# Patient Record
Sex: Female | Born: 1985 | Race: White | Hispanic: No | Marital: Single | Smoking: Current every day smoker
Health system: Southern US, Community
[De-identification: ages and names within clinical notes are randomized; demographics above are authoritative.]

## PROBLEM LIST (undated history)

## (undated) DIAGNOSIS — F909 Attention-deficit hyperactivity disorder, unspecified type: Secondary | ICD-10-CM

## (undated) DIAGNOSIS — K589 Irritable bowel syndrome without diarrhea: Secondary | ICD-10-CM

## (undated) DIAGNOSIS — F329 Major depressive disorder, single episode, unspecified: Secondary | ICD-10-CM

## (undated) DIAGNOSIS — F32A Depression, unspecified: Secondary | ICD-10-CM

## (undated) DIAGNOSIS — R002 Palpitations: Secondary | ICD-10-CM

## (undated) HISTORY — PX: TONSILLECTOMY: SUR1361

## (undated) HISTORY — DX: Palpitations: R00.2

---

## 2015-08-06 ENCOUNTER — Emergency Department (HOSPITAL_BASED_OUTPATIENT_CLINIC_OR_DEPARTMENT_OTHER)
Admission: EM | Admit: 2015-08-06 | Discharge: 2015-08-07 | Disposition: A | Discharge status: Home / Self Care | Payer: 59 | Attending: Emergency Medicine | Admitting: Emergency Medicine

## 2015-08-06 ENCOUNTER — Encounter (HOSPITAL_BASED_OUTPATIENT_CLINIC_OR_DEPARTMENT_OTHER): Payer: Self-pay | Admitting: *Deleted

## 2015-08-06 ENCOUNTER — Emergency Department (HOSPITAL_BASED_OUTPATIENT_CLINIC_OR_DEPARTMENT_OTHER): Payer: 59

## 2015-08-06 DIAGNOSIS — F17210 Nicotine dependence, cigarettes, uncomplicated: Secondary | ICD-10-CM | POA: Diagnosis not present

## 2015-08-06 DIAGNOSIS — R0602 Shortness of breath: Secondary | ICD-10-CM | POA: Diagnosis not present

## 2015-08-06 DIAGNOSIS — Z79899 Other long term (current) drug therapy: Secondary | ICD-10-CM | POA: Insufficient documentation

## 2015-08-06 DIAGNOSIS — R079 Chest pain, unspecified: Secondary | ICD-10-CM | POA: Insufficient documentation

## 2015-08-06 DIAGNOSIS — R002 Palpitations: Secondary | ICD-10-CM | POA: Diagnosis present

## 2015-08-06 DIAGNOSIS — F329 Major depressive disorder, single episode, unspecified: Secondary | ICD-10-CM | POA: Diagnosis not present

## 2015-08-06 HISTORY — DX: Irritable bowel syndrome without diarrhea: K58.9

## 2015-08-06 HISTORY — DX: Major depressive disorder, single episode, unspecified: F32.9

## 2015-08-06 HISTORY — DX: Depression, unspecified: F32.A

## 2015-08-06 HISTORY — DX: Attention-deficit hyperactivity disorder, unspecified type: F90.9

## 2015-08-06 LAB — CBC WITH DIFFERENTIAL/PLATELET
BASOS ABS: 0 10*3/uL (ref 0.0–0.1)
BASOS PCT: 0 %
EOS PCT: 2 %
Eosinophils Absolute: 0.2 10*3/uL (ref 0.0–0.7)
HEMATOCRIT: 36.8 % (ref 36.0–46.0)
Hemoglobin: 12.7 g/dL (ref 12.0–15.0)
Lymphocytes Relative: 36 %
Lymphs Abs: 3.3 10*3/uL (ref 0.7–4.0)
MCH: 28.2 pg (ref 26.0–34.0)
MCHC: 34.5 g/dL (ref 30.0–36.0)
MCV: 81.6 fL (ref 78.0–100.0)
MONO ABS: 0.8 10*3/uL (ref 0.1–1.0)
Monocytes Relative: 8 %
NEUTROS ABS: 4.9 10*3/uL (ref 1.7–7.7)
Neutrophils Relative %: 54 %
PLATELETS: 250 10*3/uL (ref 150–400)
RBC: 4.51 MIL/uL (ref 3.87–5.11)
RDW: 12.7 % (ref 11.5–15.5)
WBC: 9.1 10*3/uL (ref 4.0–10.5)

## 2015-08-06 MED ORDER — SODIUM CHLORIDE 0.9 % IV BOLUS (SEPSIS)
500.0000 mL | Freq: Once | INTRAVENOUS | Status: AC
  Administered 2015-08-07: 500 mL via INTRAVENOUS

## 2015-08-06 NOTE — ED Notes (Addendum)
Heart palpitations x 3 weeks. Worse at night. States she wore a Holter monitor in the past for same but never saw any abnormalities. She made life style changes by stopping caffeine.

## 2015-08-06 NOTE — ED Provider Notes (Signed)
CSN: [id number]     Arrival date & time 08/06/15  2135 History  By signing my name below, I, [facility] Washington, attest that this documentation has been prepared under the direction and in the presence of Yadhira Mckneely, MD. Electronically Signed: Randell PatientMarrissa Washington, ED Scribe. 08/07/2015. 12:00 AM.     Chief Complaint  Patient presents with  . Palpitations    Patient is a 30 y.o. female presenting with palpitations. The history is provided by the patient. No language interpreter was used.  Palpitations Palpitations quality:  Fast Onset quality:  Sudden Timing:  Constant Progression:  Waxing and waning Chronicity:  Recurrent Context: not anxiety, not appetite suppressants and not caffeine   Relieved by:  Nothing Worsened by:  Nothing Ineffective treatments:  Beta blockers Associated symptoms: chest pain   Associated symptoms: no diaphoresis   Risk factors: no diabetes mellitus     HPI Comments: Lovenia Kimshley Krukowski is a 30 y.o. female with a hx of ADHD who presents to the Emergency Department complaining of intermittent, moderate, gradually worsening heart palpitations onset 3 weeks ago, significantly worse in the past week. Pt states that over the past three weeks she has had recurrent episodes of heart palpitations with associated CP and SOB. Palpations worse at rest. She notes that these symptoms originally resolved with drinking Vitamin Water but that this drink has not provided any relief in the past week. She has taken a beta blocker today, stayed well-hydrated, and slept well without relief.  She takes Adderall periodically. Per pt, she notes similar symptoms in the past when she drank caffeinated beverages. Denies consuming caffeine. Denies any other symptoms currently.  Past Medical History  Diagnosis Date  . Depression   . IBS (irritable bowel syndrome)   . ADHD (attention deficit hyperactivity disorder)    Past Surgical History  Procedure Laterality Date  . Tonsillectomy      No family history on file. Social History  Substance Use Topics  . Smoking status: Current Every Day Smoker    Types: E-cigarettes  . Smokeless tobacco: None  . Alcohol Use: Yes     Comment: socc   OB History    No data available     Review of Systems  Constitutional: Negative for diaphoresis.  Respiratory: Negative for wheezing and stridor.   Cardiovascular: Positive for chest pain and palpitations. Negative for leg swelling.  All other systems reviewed and are negative.     Allergies  Review of patient's allergies indicates no known allergies.  Home Medications   Prior to Admission medications   Medication Sig Start Date End Date Taking? Authorizing Provider  Amphetamine-Dextroamphetamine (ADDERALL PO) Take by mouth.   Yes Historical Provider, MD  Citalopram Hydrobromide (CELEXA PO) Take by mouth.   Yes Historical Provider, MD  ClonazePAM (KLONOPIN PO) Take by mouth.   Yes Historical Provider, MD  Cyclobenzaprine HCl (FLEXERIL PO) Take by mouth.   Yes Historical Provider, MD  Dicyclomine HCl (BENTYL PO) Take by mouth.   Yes Historical Provider, MD  Esomeprazole Magnesium (NEXIUM PO) Take by mouth.   Yes Historical Provider, MD   BP 129/91 mmHg  Pulse 68  Temp(Src) 98.4 F (36.9 C) (Oral)  Resp 10  Ht 5\' 6"  (1.676 m)  Wt 140 lb (63.504 kg)  BMI 22.61 kg/m2  SpO2 100% Physical Exam  Constitutional: She is oriented to person, place, and time. She appears well-developed and well-nourished. No distress.  HENT:  Head: Normocephalic and atraumatic.  Mouth/Throat: Oropharynx is clear and moist  and mucous membranes are normal. No oropharyngeal exudate.  Moist mucus membranes.  Eyes: EOM are normal. Pupils are equal, round, and reactive to light.  Neck: Trachea normal and normal range of motion. Carotid bruit is not present.  Cardiovascular: Normal rate, regular rhythm and intact distal pulses.   Pulmonary/Chest: Effort normal and breath sounds normal. No stridor. No  respiratory distress. She has no wheezes. She has no rales.  Lungs CTA bilaterally.  Abdominal: Soft. Bowel sounds are normal. She exhibits no distension. There is no tenderness. There is no rebound and no guarding.  Musculoskeletal: Normal range of motion. She exhibits no edema or tenderness.  Neurological: She is alert and oriented to person, place, and time. She has normal reflexes.  Skin: Skin is warm and dry. She is not diaphoretic.  Psychiatric: She has a normal mood and affect. Her behavior is normal.  Nursing note and vitals reviewed.   ED Course  Procedures (including critical care time)  DIAGNOSTIC STUDIES: Oxygen Saturation is 100% on RA, normal by my interpretation.    COORDINATION OF CARE: 11:41 PM Will order, IV fluids, chest x-ray, BMP, CBC, pregnancy test, troponin, and d-dimer. Discussed treatment plan with pt at bedside and pt agreed to plan.  Medications  sodium chloride 0.9 % bolus 500 mL (not administered)    Labs Review Labs Reviewed  CBC WITH DIFFERENTIAL/PLATELET  BASIC METABOLIC PANEL  TROPONIN I  PREGNANCY, URINE  D-DIMER, QUANTITATIVE (NOT AT [facility])    Imaging Review No results found. I have personally reviewed and evaluated these images and lab results as part of my medical decision-making.   EKG Interpretation   Date/Time:  Friday August 06 2015 21:46:05 EDT Ventricular Rate:  81 PR Interval:  156 QRS Duration: 84 QT Interval:  368 QTC Calculation: 427 R Axis:   89 Text Interpretation:  Normal sinus rhythm Confirmed by [facility]  MD,  Josiah Nieto ([id number]) on 08/06/2015 11:39:00 PM      MDM   Final diagnoses:  None    Filed Vitals:   08/06/15 2142 08/06/15 2332  BP: 155/111 129/91  Pulse: 92 68  Temp: 98.4 F (36.9 C)   Resp: 20 10   Results for orders placed or performed during the hospital encounter of 08/06/15  CBC with Differential/Platelet  Result Value Ref Range   WBC 9.1 4.0 - 10.5 K/uL   RBC 4.51 3.87 - 5.11 MIL/uL    Hemoglobin 12.7 12.0 - 15.0 g/dL   HCT 60.4 54.0 - 98.1 %   MCV 81.6 78.0 - 100.0 fL   MCH 28.2 26.0 - 34.0 pg   MCHC 34.5 30.0 - 36.0 g/dL   RDW 19.1 47.8 - 29.5 %   Platelets 250 150 - 400 K/uL   Neutrophils Relative % 54 %   Neutro Abs 4.9 1.7 - 7.7 K/uL   Lymphocytes Relative 36 %   Lymphs Abs 3.3 0.7 - 4.0 K/uL   Monocytes Relative 8 %   Monocytes Absolute 0.8 0.1 - 1.0 K/uL   Eosinophils Relative 2 %   Eosinophils Absolute 0.2 0.0 - 0.7 K/uL   Basophils Relative 0 %   Basophils Absolute 0.0 0.0 - 0.1 K/uL  Basic metabolic panel  Result Value Ref Range   Sodium 137 135 - 145 mmol/L   Potassium 3.2 (L) 3.5 - 5.1 mmol/L   Chloride 103 101 - 111 mmol/L   CO2 27 22 - 32 mmol/L   Glucose, Bld 98 65 - 99 mg/dL   BUN 10  6 - 20 mg/dL   Creatinine, Ser 0.98 0.44 - 1.00 mg/dL   Calcium 8.8 (L) 8.9 - 10.3 mg/dL   GFR calc non Af Amer >60 >60 mL/min   GFR calc Af Amer >60 >60 mL/min   Anion gap 7 5 - 15  Troponin I  Result Value Ref Range   Troponin I <0.03 <0.03 ng/mL  Pregnancy, urine  Result Value Ref Range   Preg Test, Ur NEGATIVE NEGATIVE  D-dimer, quantitative (not at [facility])  Result Value Ref Range   D-Dimer, Quant <0.27 0.00 - 0.50 ug/mL-FEU   No results found.  Medications  potassium chloride SA (K-DUR,KLOR-CON) CR tablet 40 mEq (not administered)  sodium chloride 0.9 % bolus 500 mL (500 mLs Intravenous New Bag/Given 08/07/15 0001)   Stop e cigarettes.  Follow up with cardiology for holter monitor.  Based on history and exam patient has been appropriately medically screened and emergency conditions excluded. Patient is stable for discharge at this time.   Follow up provided and strict return precautions given.    I personally performed the services described in this documentation, which was scribed in my presence. The recorded information has been reviewed and is accurate.      Cy Blamer, MD 08/07/15 [phone]

## 2015-08-06 NOTE — ED Notes (Signed)
Pt states she has been going in and out of trigeminy and bigeminy x several days. Pt states palpitations are worse when laying down and reports chest pain and shob when having the palpitations. Pt states she has been staying well hydrated.

## 2015-08-07 ENCOUNTER — Encounter (HOSPITAL_BASED_OUTPATIENT_CLINIC_OR_DEPARTMENT_OTHER): Payer: Self-pay | Admitting: Emergency Medicine

## 2015-08-07 LAB — BASIC METABOLIC PANEL
ANION GAP: 7 (ref 5–15)
BUN: 10 mg/dL (ref 6–20)
CALCIUM: 8.8 mg/dL — AB (ref 8.9–10.3)
CHLORIDE: 103 mmol/L (ref 101–111)
CO2: 27 mmol/L (ref 22–32)
CREATININE: 0.56 mg/dL (ref 0.44–1.00)
Glucose, Bld: 98 mg/dL (ref 65–99)
POTASSIUM: 3.2 mmol/L — AB (ref 3.5–5.1)
SODIUM: 137 mmol/L (ref 135–145)

## 2015-08-07 LAB — TROPONIN I: Troponin I: <0.03 ng/mL (ref <0.03)

## 2015-08-07 LAB — D-DIMER, QUANTITATIVE: D-Dimer, Quant: <0.27 ug/mL-FEU (ref 0.00–0.50)

## 2015-08-07 LAB — PREGNANCY, URINE: Preg Test, Ur: NEGATIVE

## 2015-08-07 MED ORDER — POTASSIUM CHLORIDE CRYS ER 20 MEQ PO TBCR
40.0000 meq | EXTENDED_RELEASE_TABLET | Freq: Once | ORAL | Status: AC
  Administered 2015-08-07: 40 meq via ORAL

## 2015-08-07 MED ORDER — POTASSIUM CHLORIDE CRYS ER 20 MEQ PO TBCR
EXTENDED_RELEASE_TABLET | ORAL | Status: AC
  Filled 2015-08-07: qty 2

## 2015-08-07 NOTE — Discharge Instructions (Signed)
Holter Monitoring A Holter monitor is a small device that is used to detect abnormal heart rhythms. It clips to your clothing and is connected by wires to flat, sticky disks (electrodes) that attach to your chest. It is worn continuously for 24-48 hours. HOME CARE INSTRUCTIONS  Wear your Holter monitor at all times, even while exercising and sleeping, for as long as directed by your health care provider.  Make sure that the Holter monitor is safely clipped to your clothing or close to your body as recommended by your health care provider.  Do not get the monitor or wires wet.  Do not put body lotion or moisturizer on your chest.  Keep your skin clean.  Keep a diary of your daily activities, such as walking and doing chores. If you feel that your heartbeat is abnormal or that your heart is fluttering or skipping a beat:  Record what you are doing when it happens.  Record what time of day the symptoms occur.  Return your Holter monitor as directed by your health care provider.  Keep all follow-up visits as directed by your health care provider. This is important. SEEK IMMEDIATE MEDICAL CARE IF:  You feel lightheaded or you faint.  You have trouble breathing.  You feel pain in your chest, upper arm, or jaw.  You feel sick to your stomach and your skin is pale, cool, or damp.  You heartbeat feels unusual or abnormal.   This information is not intended to replace advice given to you by your health care provider. Make sure you discuss any questions you have with your health care provider.   Document Released: 10/08/2003 Document Revised: 01/30/2014 Document Reviewed: 08/18/2013 Elsevier Interactive Patient Education 2016 [employer]  

## 2015-08-09 DIAGNOSIS — R002 Palpitations: Secondary | ICD-10-CM | POA: Insufficient documentation

## 2015-08-09 DIAGNOSIS — F909 Attention-deficit hyperactivity disorder, unspecified type: Secondary | ICD-10-CM | POA: Insufficient documentation

## 2015-08-09 DIAGNOSIS — K589 Irritable bowel syndrome without diarrhea: Secondary | ICD-10-CM | POA: Insufficient documentation

## 2015-08-25 ENCOUNTER — Ambulatory Visit: Payer: 59 | Admitting: Cardiology

## 2015-09-05 ENCOUNTER — Encounter: Payer: Self-pay | Admitting: Cardiology

## 2015-09-06 ENCOUNTER — Encounter: Payer: Self-pay | Admitting: Cardiology

## 2015-09-06 ENCOUNTER — Ambulatory Visit (INDEPENDENT_AMBULATORY_CARE_PROVIDER_SITE_OTHER): Payer: 59 | Admitting: Cardiology

## 2015-09-06 VITALS — BP 110/82 | HR 73 | Ht 66.0 in | Wt 145.4 lb

## 2015-09-06 DIAGNOSIS — R002 Palpitations: Secondary | ICD-10-CM | POA: Diagnosis not present

## 2015-09-06 NOTE — Progress Notes (Signed)
Electrophysiology Office Note   Date:  09/07/2015   ID:  Lindsay Cox, DOB 07-20-1985, MRN [id number]  PCP:  Pcp Not In System  Primary Electrophysiologist:  Lindsay Whitfill Jorja LoaMartin Aneisa Karren, MD    Chief Complaint  Patient presents with  . New Patient (Initial Visit)  . Palpitations     History of Present Illness: Lindsay Cox is a 30 y.o. female who presents today for electrophysiology evaluation.   History of ADHD presented to the ER 7/14 with worsening palpitations over the last 3 months. Palpitations associated with CP and SOB.  They are worse at rest, but do occur when active.  Her symptoms at that time were relieved by drinking vitamin water.  She has had similar symptoms when drinking caffeine.  She also says that they are worse when she is laying down to sleep.  She says that these may be influenced by her GI symptoms of GERD and her hiatal hernia.    Today, she denies symptoms of chest pain, shortness of breath, orthopnea, PND, lower extremity edema, claudication, dizziness, presyncope, syncope, bleeding, or neurologic sequela. The patient is tolerating medications without difficulties and is otherwise without complaint today.    Past Medical History:  Diagnosis Date  . ADHD (attention deficit hyperactivity disorder)   . Depression   . IBS (irritable bowel syndrome)   . Palpitations    Past Surgical History:  Procedure Laterality Date  . TONSILLECTOMY       Current Outpatient Prescriptions  Medication Sig Dispense Refill  . amphetamine-dextroamphetamine (ADDERALL XR) 20 MG 24 hr capsule Take 20 mg by mouth daily as needed.    . citalopram (CELEXA) 40 MG tablet Take 40 mg by mouth daily.    . ClonazePAM (KLONOPIN PO) Take 5 mg by mouth daily as needed.     . Cyclobenzaprine HCl (FLEXERIL PO) Take 5 mg by mouth 2 (two) times daily as needed.     . dicyclomine (BENTYL) 20 MG tablet Take 20 mg by mouth daily as needed.    Marland Kitchen. esomeprazole (NEXIUM) 20 MG capsule Take 20 mg by mouth  daily as needed.    Marland Kitchen. ibuprofen (ADVIL,MOTRIN) 200 MG tablet Take 200 mg by mouth every 6 (six) hours as needed.     No current facility-administered medications for this visit.     Allergies:   Review of patient's allergies indicates no known allergies.   Social History:  The patient  reports that she has been smoking E-cigarettes.  She has never used smokeless tobacco. She reports that she drinks alcohol. She reports that she does not use drugs.   Family History:  The patient's family history includes Anxiety disorder in her paternal grandmother; Depression in her paternal grandmother; Drug abuse in her father; Hypertension in her maternal grandfather; Throat cancer in her maternal grandfather and mother.    ROS:  Please see the history of present illness.   Otherwise, review of systems is positive for palpitations.   All other systems are reviewed and negative.    PHYSICAL EXAM: VS:  BP 110/82   Pulse 73   Ht 5\' 6"  (1.676 m)   Wt 145 lb 6.4 oz (66 kg)   BMI 23.47 kg/m  , BMI Body mass index is 23.47 kg/m. GEN: Well nourished, well developed, in no acute distress  HEENT: normal  Neck: no JVD, carotid bruits, or masses Cardiac: RRR; no murmurs, rubs, or gallops,no edema  Respiratory:  clear to auscultation bilaterally, normal work of breathing GI: soft, nontender,  nondistended, + BS MS: no deformity or atrophy  Skin: warm and dry Neuro:  Strength and sensation are intact Psych: euthymic mood, full affect  EKG:  EKG is ordered today. Personal review of the ekg ordered shows sinus rhythm, rate 73  ECGs from EMS show occasional PVCs  Recent Labs: 08/06/2015: BUN 10; Creatinine, Ser 0.56; Hemoglobin 12.7; Platelets 250; Potassium 3.2; Sodium 137    Lipid Panel  No results found for: CHOL, TRIG, HDL, CHOLHDL, VLDL, LDLCALC, LDLDIRECT   Wt Readings from Last 3 Encounters:  09/06/15 145 lb 6.4 oz (66 kg)  08/06/15 140 lb (63.5 kg)      Other studies  Reviewed: Additional studies/ records that were reviewed today include: ER notes   ASSESSMENT AND PLAN:  1.  Palpitations: Had a high burden of PVCs on the rhythm strip that she brought in. Unsure if she is having enough to cause a change in her LVEF.  Due to that, Lindsay Cox order a holter monitor to further assess her PVC burden.  If it is elevated, Lindsay Cox potentially plan for ablation.  If it is lower, Lindsay Cox try medical management.    Current medicines are reviewed at length with the patient today.   The patient does not have concerns regarding her medicines.  The following changes were made today:  none  Labs/ tests ordered today include:  Orders Placed This Encounter  Procedures  . Holter monitor - 48 hour  . EKG 12-Lead     Disposition:   FU with Lindsay Cox 3 months  Signed, Lindsay Luiz Jorja LoaMartin Corliss Coggeshall, MD  09/07/2015 1:36 PM     [facility] [address] [place] [address] [phone] (office) [phone] (fax)

## 2015-09-06 NOTE — Patient Instructions (Signed)
Your physician has recommended that you wear a holter monitor. Holter monitors are medical devices that record the heart's electrical activity. Doctors most often use these monitors to diagnose arrhythmias. Arrhythmias are problems with the speed or rhythm of the heartbeat. The monitor is a small, portable device. You can wear one while you do your normal daily activities. This is usually used to diagnose what is causing palpitations/syncope (passing out).  Follow up with your physician will depend on test results.

## 2018-06-16 IMAGING — DX DG CHEST 2V
2 series · 2 of 2 positions shown · non-contrast
Comparison: None.

CLINICAL DATA: Heart palpitations for 3 weeks. Chest pain and
shortness of breath.

EXAM:
CHEST  2 VIEW

[chest pa]
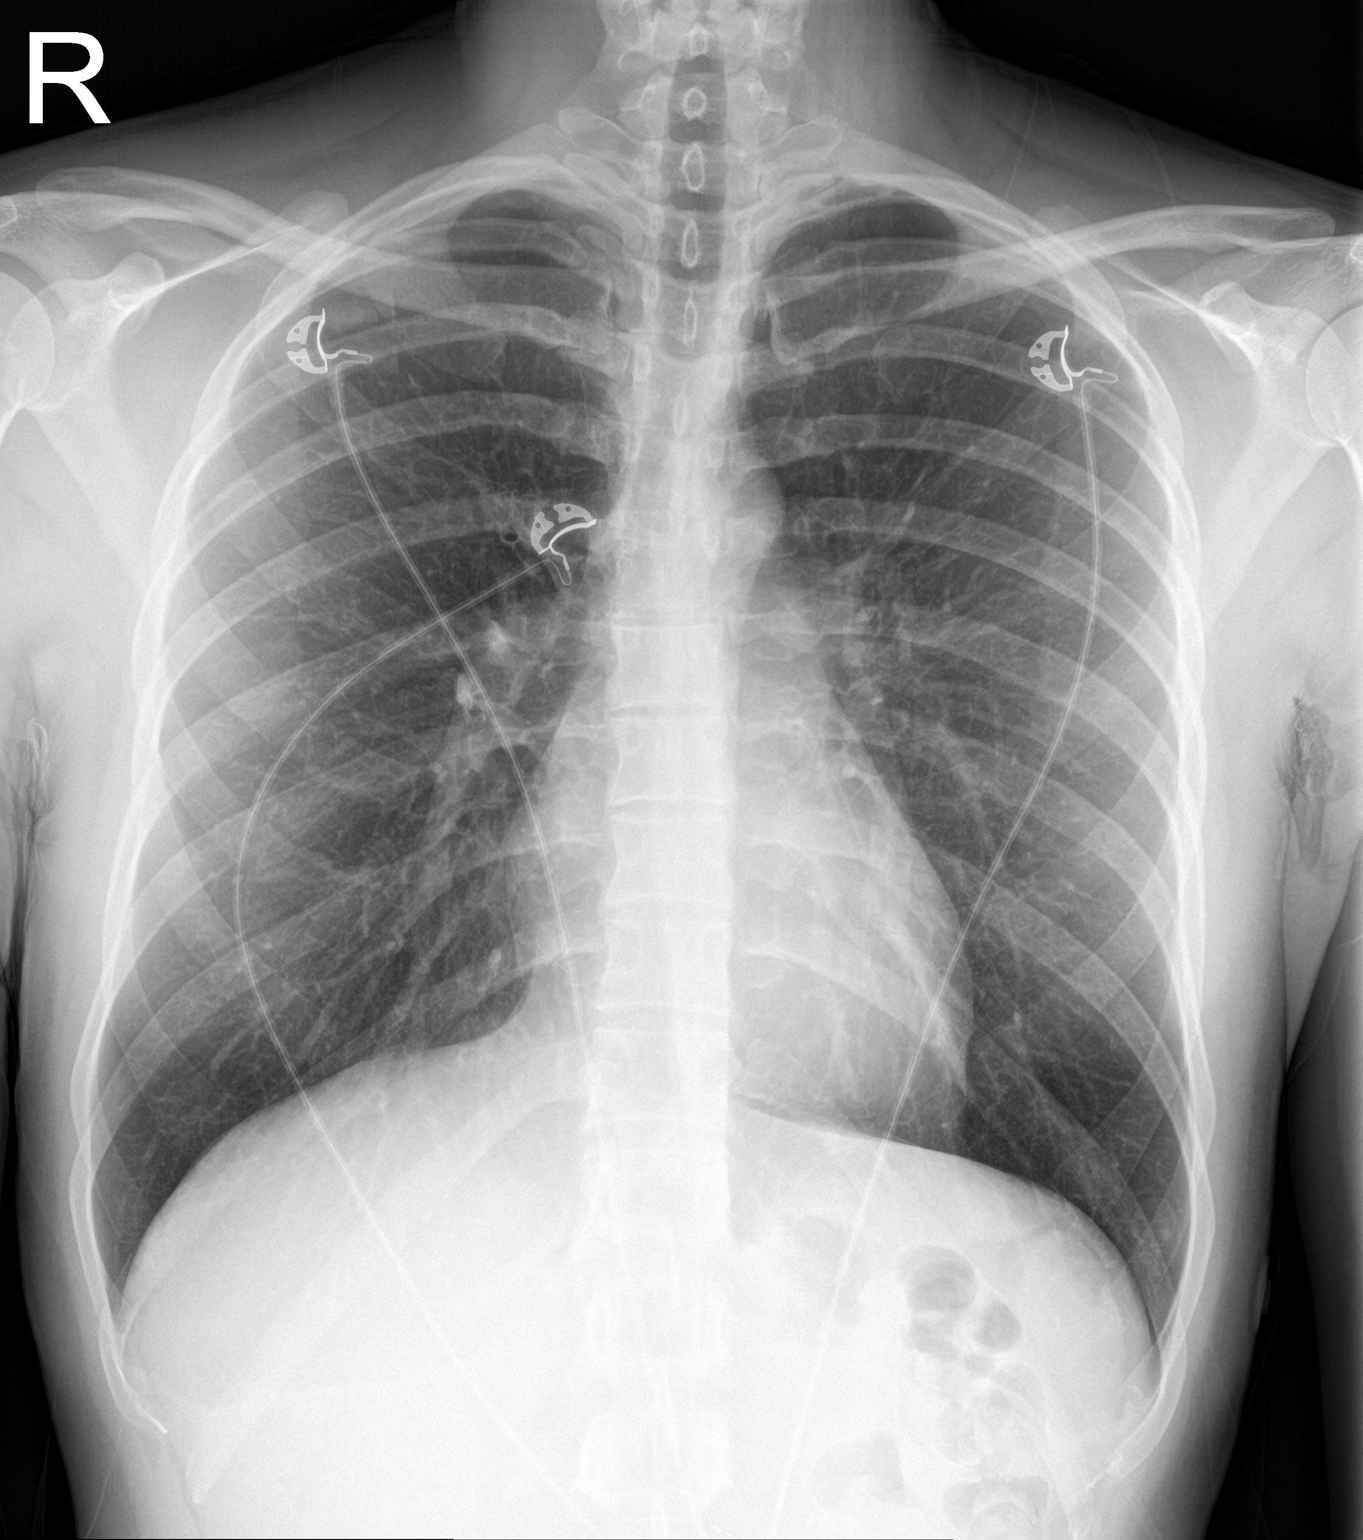

[chest lat]
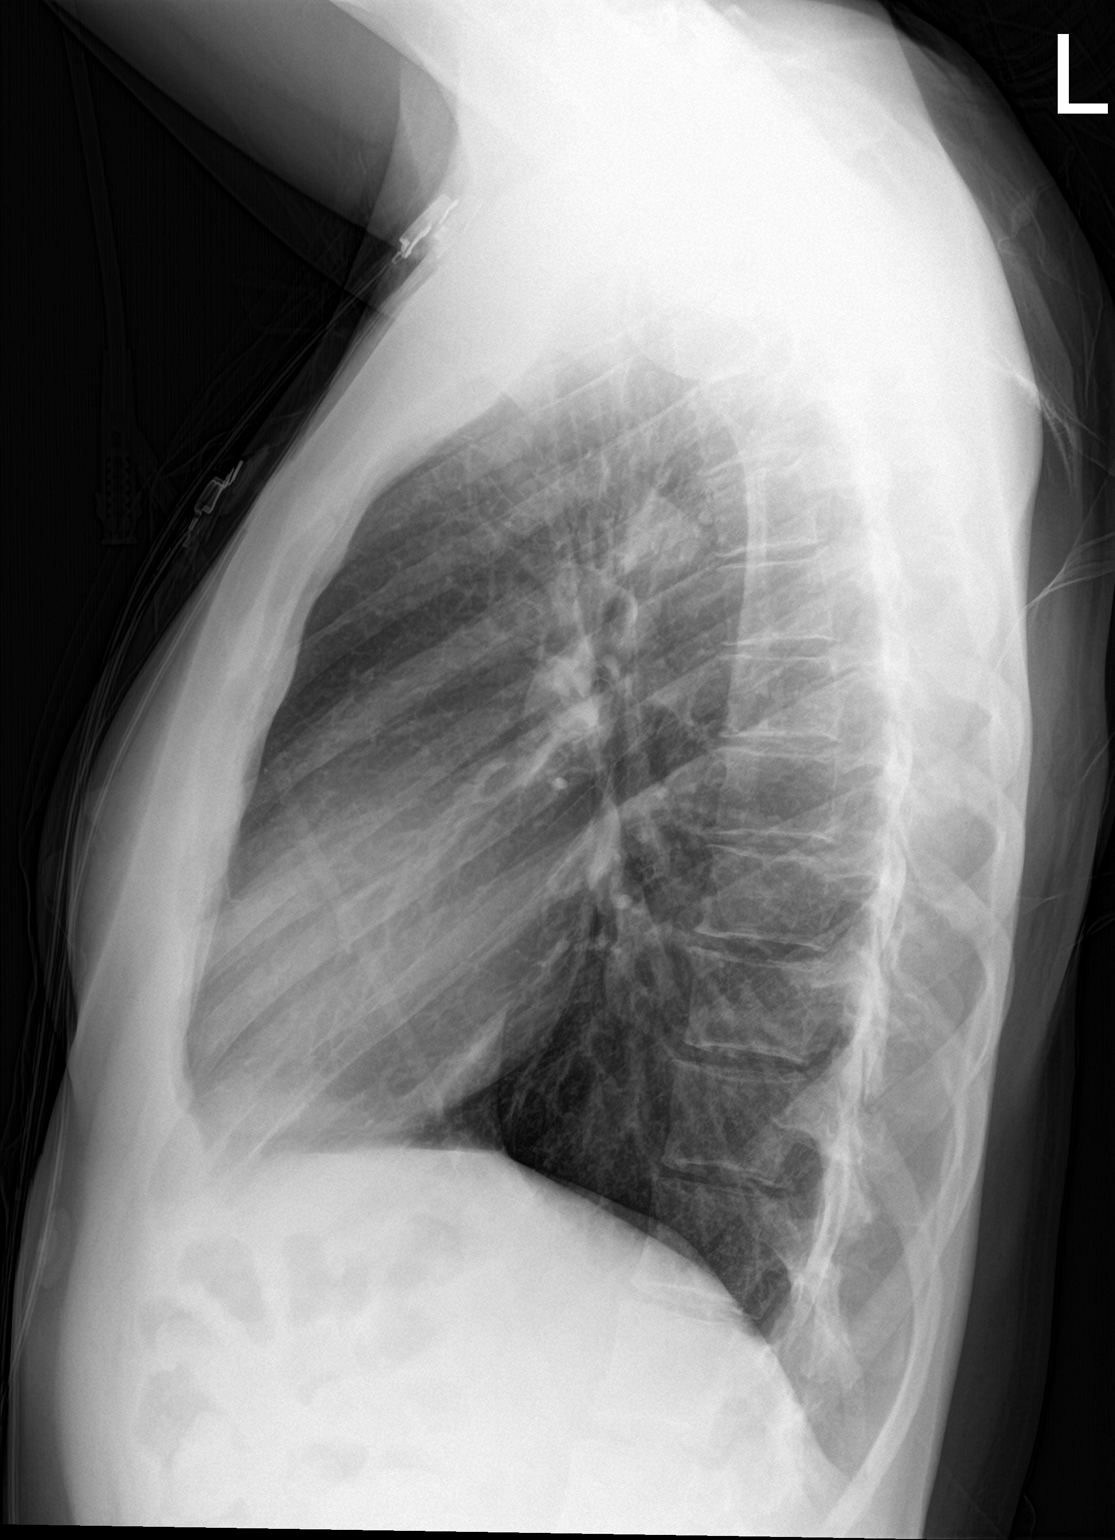

[2 of 2 positions shown; findings below may reference images not displayed]

FINDINGS: The heart size and mediastinal contours are within normal limits.
Both lungs are clear. The visualized skeletal structures are
unremarkable.
IMPRESSION: No active cardiopulmonary disease.
